# Patient Record
Sex: Female | Born: 1950 | Race: White | Hispanic: No | Smoking: Former smoker
Health system: Southern US, Community
[De-identification: ages and names within clinical notes are randomized; demographics above are authoritative.]

## PROBLEM LIST (undated history)

## (undated) DIAGNOSIS — F0390 Unspecified dementia without behavioral disturbance: Secondary | ICD-10-CM

## (undated) HISTORY — PX: CHOLECYSTECTOMY: SHX55

## (undated) HISTORY — PX: APPENDECTOMY: SHX54

## (undated) HISTORY — PX: ABDOMINAL HYSTERECTOMY: SHX81

---

## 2018-08-24 ENCOUNTER — Emergency Department: Payer: Medicare Other

## 2018-08-24 ENCOUNTER — Encounter: Payer: Self-pay | Admitting: *Deleted

## 2018-08-24 ENCOUNTER — Emergency Department
Admission: EM | Admit: 2018-08-24 | Discharge: 2018-08-24 | Disposition: A | Discharge status: Home / Self Care | Payer: Medicare Other | Attending: Emergency Medicine | Admitting: Emergency Medicine

## 2018-08-24 ENCOUNTER — Other Ambulatory Visit: Payer: Self-pay

## 2018-08-24 DIAGNOSIS — Z79899 Other long term (current) drug therapy: Secondary | ICD-10-CM | POA: Insufficient documentation

## 2018-08-24 DIAGNOSIS — Y998 Other external cause status: Secondary | ICD-10-CM | POA: Diagnosis not present

## 2018-08-24 DIAGNOSIS — S8001XA Contusion of right knee, initial encounter: Secondary | ICD-10-CM

## 2018-08-24 DIAGNOSIS — Y9389 Activity, other specified: Secondary | ICD-10-CM | POA: Insufficient documentation

## 2018-08-24 DIAGNOSIS — S0101XA Laceration without foreign body of scalp, initial encounter: Secondary | ICD-10-CM

## 2018-08-24 DIAGNOSIS — S0083XA Contusion of other part of head, initial encounter: Secondary | ICD-10-CM

## 2018-08-24 DIAGNOSIS — S90111A Contusion of right great toe without damage to nail, initial encounter: Secondary | ICD-10-CM

## 2018-08-24 DIAGNOSIS — W19XXXA Unspecified fall, initial encounter: Secondary | ICD-10-CM

## 2018-08-24 DIAGNOSIS — Z87891 Personal history of nicotine dependence: Secondary | ICD-10-CM | POA: Insufficient documentation

## 2018-08-24 DIAGNOSIS — S0990XA Unspecified injury of head, initial encounter: Secondary | ICD-10-CM

## 2018-08-24 DIAGNOSIS — Y92009 Unspecified place in unspecified non-institutional (private) residence as the place of occurrence of the external cause: Secondary | ICD-10-CM | POA: Insufficient documentation

## 2018-08-24 DIAGNOSIS — F0390 Unspecified dementia without behavioral disturbance: Secondary | ICD-10-CM | POA: Diagnosis not present

## 2018-08-24 DIAGNOSIS — W108XXA Fall (on) (from) other stairs and steps, initial encounter: Secondary | ICD-10-CM | POA: Insufficient documentation

## 2018-08-24 HISTORY — DX: Unspecified dementia, unspecified severity, without behavioral disturbance, psychotic disturbance, mood disturbance, and anxiety: F03.90

## 2018-08-24 LAB — COMPREHENSIVE METABOLIC PANEL
ALK PHOS: 67 U/L (ref 38–126)
ALT: 19 U/L (ref 0–44)
ANION GAP: 7 (ref 5–15)
AST: 26 U/L (ref 15–41)
Albumin: 3.6 g/dL (ref 3.5–5.0)
BILIRUBIN TOTAL: 0.9 mg/dL (ref 0.3–1.2)
BUN: 19 mg/dL (ref 8–23)
CALCIUM: 9 mg/dL (ref 8.9–10.3)
CO2: 27 mmol/L (ref 22–32)
CREATININE: 0.72 mg/dL (ref 0.44–1.00)
Chloride: 103 mmol/L (ref 98–111)
GFR calc Af Amer: >60 mL/min (ref >60)
Glucose, Bld: 130 mg/dL — ABNORMAL HIGH (ref 70–99)
Potassium: 3.8 mmol/L (ref 3.5–5.1)
Sodium: 137 mmol/L (ref 135–145)
TOTAL PROTEIN: 6.4 g/dL — AB (ref 6.5–8.1)

## 2018-08-24 LAB — CBC WITH DIFFERENTIAL/PLATELET
ABS IMMATURE GRANULOCYTES: 0.07 10*3/uL (ref 0.00–0.07)
BASOS ABS: 0.1 10*3/uL (ref 0.0–0.1)
Basophils Relative: 1 %
EOS PCT: 1 %
Eosinophils Absolute: 0.1 10*3/uL (ref 0.0–0.5)
HCT: 37.3 % (ref 36.0–46.0)
Hemoglobin: 12.3 g/dL (ref 12.0–15.0)
Immature Granulocytes: 1 %
LYMPHS PCT: 21 %
Lymphs Abs: 2.3 10*3/uL (ref 0.7–4.0)
MCH: 30.1 pg (ref 26.0–34.0)
MCHC: 33 g/dL (ref 30.0–36.0)
MCV: 91.2 fL (ref 80.0–100.0)
Monocytes Absolute: 0.7 10*3/uL (ref 0.1–1.0)
Monocytes Relative: 7 %
NRBC: 0 % (ref 0.0–0.2)
Neutro Abs: 7.7 10*3/uL (ref 1.7–7.7)
Neutrophils Relative %: 69 %
Platelets: 254 10*3/uL (ref 150–400)
RBC: 4.09 MIL/uL (ref 3.87–5.11)
RDW: 12.8 % (ref 11.5–15.5)
WBC: 10.8 10*3/uL — ABNORMAL HIGH (ref 4.0–10.5)

## 2018-08-24 LAB — PROTIME-INR
INR: 0.91
PROTHROMBIN TIME: 12.2 s (ref 11.4–15.2)

## 2018-08-24 LAB — ETHANOL: Alcohol, Ethyl (B): <10 mg/dL (ref <10)

## 2018-08-24 MED ORDER — HYDROCODONE-ACETAMINOPHEN 5-325 MG PO TABS
1.0000 | ORAL_TABLET | Freq: Once | ORAL | Status: AC
  Administered 2018-08-24: 1 via ORAL
  Filled 2018-08-24: qty 1

## 2018-08-24 MED ORDER — ONDANSETRON 4 MG PO TBDP: 4.0000 mg | ORAL_TABLET | Freq: Three times a day (TID) | ORAL | 0 refills | Status: AC | PRN

## 2018-08-24 MED ORDER — FENTANYL CITRATE (PF) 100 MCG/2ML IJ SOLN
25.0000 ug | Freq: Once | INTRAMUSCULAR | Status: AC
  Administered 2018-08-24: 25 ug via INTRAVENOUS
  Filled 2018-08-24: qty 2

## 2018-08-24 MED ORDER — HYDROCODONE-ACETAMINOPHEN 5-325 MG PO TABS: 1.0000 | ORAL_TABLET | Freq: Four times a day (QID) | ORAL | 0 refills | Status: AC | PRN

## 2018-08-24 NOTE — Discharge Instructions (Signed)
1.  Staple removal in 7 to 10 days. 2.  Apply ice to affected area several times daily. 3.  You may take pain and nausea medicines as needed (Norco/Zofran #15). 4.  Return to the ER for worsening symptoms, persistent vomiting, lethargy or other concerns.

## 2018-08-24 NOTE — ED Provider Notes (Signed)
[facility] Emergency Department Provider Note   ____________________________________________   First MD Initiated Contact with Patient 08/24/18 0134     (approximate)  I have reviewed the triage vital signs and the nursing notes.   HISTORY  Chief Complaint Fall    HPI Rose Reyes is a 67 y.o. female brought to the ED from home via EMS status post fall with head and face injury.  Patient states she turn off the light and turned to go to her bedroom and instead fell down 10-15 carpeted steps, striking her head and face on hardwood.  Incident occurred immediately prior to arrival.  Denies LOC.  Denies anticoagulant use.  EMS reports nosebleed on their arrival which is now controlled.  Patient complains of head and face pain, right knee and right great toe pain.  Denies vision changes, neck pain, chest pain, shortness of breath, abdominal pain, nausea, vomiting or dizziness.   Past Medical History:  Diagnosis Date  . Dementia (HCC)    early onset    There are no active problems to display for this patient.    Prior to Admission medications   Medication Sig Start Date End Date Taking? Authorizing Provider  galantamine (RAZADYNE) 4 MG tablet Take 4 mg by mouth 2 (two) times daily with a meal.   Yes [provider]    Allergies Librium [chlordiazepoxide]; Penicillins; and Sulfa antibiotics  No family history on file.  Social History Social History   Tobacco Use  . Smoking status: Former Smoker    Last attempt to quit: 08/25/1999    Years since quitting: 19.0  . Smokeless tobacco: Never Used  Substance Use Topics  . Alcohol use: Not Currently  . Drug use: Never    Review of Systems  Constitutional: No fever/chills Eyes: No visual changes. ENT: Positive for facial injury.  No sore throat. Cardiovascular: Denies chest pain. Respiratory: Denies shortness of breath. Gastrointestinal: No abdominal pain.  No nausea, no vomiting.  No  diarrhea.  No constipation. Genitourinary: Negative for dysuria. Musculoskeletal: Positive for right knee and great toe pain.  Negative for back pain. Skin: Negative for rash. Neurological: Positive for head injury.  Negative for headaches, focal weakness or numbness.   ____________________________________________   PHYSICAL EXAM:  VITAL SIGNS: ED Triage Vitals  Enc Vitals Group     BP      Pulse      Resp      Temp      Temp src      SpO2      Weight      Height      Head Circumference      Peak Flow      Pain Score      Pain Loc      Pain Edu?      Excl. in GC?     Constitutional: Alert and oriented. Well appearing and in mild acute distress. Eyes: Conjunctivae are normal. PERRL. EOMI. Head: Matted blood to right parietal scalp. Nose: Nasal contusion.  Clotted blood bilateral nares. Mouth/Throat: Mucous membranes are moist.  No dental malocclusion. Neck: No stridor.  No cervical spine tenderness to palpation.  No step-offs or deformities. Cardiovascular: Normal rate, regular rhythm. Grossly normal heart sounds.  Good peripheral circulation. Respiratory: Normal respiratory effort.  No retractions. Lungs CTAB. Gastrointestinal: Soft and nontender to light or deep palpation. No distention. No abdominal bruits. No CVA tenderness. Musculoskeletal: No spinal tenderness to palpation.  Pelvis is stable.  Right  anterior knee tender to palpation with full range of motion.  Right great toe contusion with tenderness to palpation.  2+ distal pulses.  Brisk, less than 5-second capillary refill. Neurologic:  Normal speech and language. No gross focal neurologic deficits are appreciated.  Skin:  Skin is warm, dry and intact. No rash noted. Psychiatric: Mood and affect are normal. Speech and behavior are normal.  ____________________________________________   LABS (all labs ordered are listed, but only abnormal results are displayed)  Labs Reviewed  CBC WITH DIFFERENTIAL/PLATELET  - Abnormal; Notable for the following components:      Result Value   WBC 10.8 (*)    All other components within normal limits  COMPREHENSIVE METABOLIC PANEL  ETHANOL  PROTIME-INR   ____________________________________________  EKG  None ____________________________________________  RADIOLOGY  ED MD interpretation: No acute traumatic injuries  Official radiology report(s): Dg Chest 1 View  Result Date: 08/24/2018 CLINICAL DATA:  Status post fall down 15 steps, with concern for chest injury. Initial encounter. EXAM: CHEST  1 VIEW COMPARISON:  None. FINDINGS: The lungs are well-aerated and clear. There is no evidence of focal opacification, pleural effusion or pneumothorax. The cardiomediastinal silhouette is within normal limits. No acute osseous abnormalities are seen. IMPRESSION: No acute cardiopulmonary process seen. No displaced rib fractures identified. Electronically Signed   By: Roanna Raider M.D.   On: 08/24/2018 02:45   Dg Pelvis 1-2 Views  Result Date: 08/24/2018 CLINICAL DATA:  Status post fall down 15 steps, with concern for pelvic injury. Initial encounter. EXAM: PELVIS - 1-2 VIEW COMPARISON:  None. FINDINGS: There is no evidence of fracture or dislocation. Both femoral heads are seated normally within their respective acetabula. No significant degenerative change is appreciated. The sacroiliac joints are unremarkable in appearance. The visualized bowel gas pattern is grossly unremarkable in appearance. Scattered phleboliths are noted within the pelvis. IMPRESSION: No evidence of fracture or dislocation. Electronically Signed   By: Roanna Raider M.D.   On: 08/24/2018 02:44   Ct Head Wo Contrast  Result Date: 08/24/2018 CLINICAL DATA:  Status post fall down 15 steps, hitting head and nose. Concern for maxillofacial or cervical spine injury. Initial encounter. EXAM: CT HEAD WITHOUT CONTRAST CT MAXILLOFACIAL WITHOUT CONTRAST CT CERVICAL SPINE WITHOUT CONTRAST TECHNIQUE:  Multidetector CT imaging of the head, cervical spine, and maxillofacial structures were performed using the standard protocol without intravenous contrast. Multiplanar CT image reconstructions of the cervical spine and maxillofacial structures were also generated. COMPARISON:  None. FINDINGS: CT HEAD FINDINGS Brain: No evidence of acute infarction, hemorrhage, hydrocephalus, extra-axial collection or mass lesion/mass effect. The posterior fossa, including the cerebellum, brainstem and fourth ventricle, is within normal limits. The third and lateral ventricles, and basal ganglia are unremarkable in appearance. The cerebral hemispheres are symmetric in appearance, with normal gray-white differentiation. No mass effect or midline shift is seen. Vascular: No hyperdense vessel or unexpected calcification. Skull: There is no evidence of fracture; visualized osseous structures are unremarkable in appearance. Other: Soft tissue swelling is noted overlying the left frontal and parietal calvarium, and a soft tissue laceration is noted at the right side of the vertex. CT MAXILLOFACIAL FINDINGS Osseous: There is no evidence of fracture or dislocation. The maxilla and mandible appear intact. The nasal bone is unremarkable in appearance. The visualized dentition demonstrates no acute abnormality. Orbits: The orbits are intact bilaterally. Sinuses: The visualized paranasal sinuses and mastoid air cells are well-aerated. Soft tissues: Diffuse soft tissue swelling is noted about the nose, with mild soft tissue  air. There is suggestion of a 4 mm high-density foreign body at the left side of the nose. The parapharyngeal fat planes are preserved. The nasopharynx, oropharynx and hypopharynx are unremarkable in appearance. The visualized portions of the valleculae and piriform sinuses are grossly unremarkable. The parotid and submandibular glands are within normal limits. No cervical lymphadenopathy is seen. CT CERVICAL SPINE FINDINGS  Alignment: Normal. Skull base and vertebrae: No acute fracture. No primary bone lesion or focal pathologic process. Soft tissues and spinal canal: No prevertebral fluid or swelling. No visible canal hematoma. Disc levels: Multilevel disc space narrowing is noted along the lower cervical spine. There is mild grade 1 anterolisthesis of C2 on C3 and of C3 on C4. Scattered anterior and posterior disc osteophyte complexes are seen along the lower cervical spine, with underlying facet disease. Upper chest: Mild calcification is noted at the carotid bifurcations bilaterally. The visualized lung apices are clear. The thyroid gland is grossly unremarkable. Other: No additional soft tissue abnormalities are seen. IMPRESSION: 1. No evidence of traumatic intracranial injury or fracture. 2. No evidence of fracture or dislocation with regard to the maxillofacial structures. 3. No evidence of fracture or subluxation along the cervical spine. 4. Soft tissue swelling overlying the left frontal and parietal calvarium, and soft tissue laceration at the right side of the vertex. 5. Suggestion of 4 mm high-density foreign body at the left side of the nose. 6. Mild degenerative change along the lower cervical spine. 7. Mild calcification at the carotid bifurcations bilaterally. Electronically Signed   By: Roanna Raider M.D.   On: 08/24/2018 02:34   Ct Cervical Spine Wo Contrast  Result Date: 08/24/2018 CLINICAL DATA:  Status post fall down 15 steps, hitting head and nose. Concern for maxillofacial or cervical spine injury. Initial encounter. EXAM: CT HEAD WITHOUT CONTRAST CT MAXILLOFACIAL WITHOUT CONTRAST CT CERVICAL SPINE WITHOUT CONTRAST TECHNIQUE: Multidetector CT imaging of the head, cervical spine, and maxillofacial structures were performed using the standard protocol without intravenous contrast. Multiplanar CT image reconstructions of the cervical spine and maxillofacial structures were also generated. COMPARISON:  None.  FINDINGS: CT HEAD FINDINGS Brain: No evidence of acute infarction, hemorrhage, hydrocephalus, extra-axial collection or mass lesion/mass effect. The posterior fossa, including the cerebellum, brainstem and fourth ventricle, is within normal limits. The third and lateral ventricles, and basal ganglia are unremarkable in appearance. The cerebral hemispheres are symmetric in appearance, with normal gray-white differentiation. No mass effect or midline shift is seen. Vascular: No hyperdense vessel or unexpected calcification. Skull: There is no evidence of fracture; visualized osseous structures are unremarkable in appearance. Other: Soft tissue swelling is noted overlying the left frontal and parietal calvarium, and a soft tissue laceration is noted at the right side of the vertex. CT MAXILLOFACIAL FINDINGS Osseous: There is no evidence of fracture or dislocation. The maxilla and mandible appear intact. The nasal bone is unremarkable in appearance. The visualized dentition demonstrates no acute abnormality. Orbits: The orbits are intact bilaterally. Sinuses: The visualized paranasal sinuses and mastoid air cells are well-aerated. Soft tissues: Diffuse soft tissue swelling is noted about the nose, with mild soft tissue air. There is suggestion of a 4 mm high-density foreign body at the left side of the nose. The parapharyngeal fat planes are preserved. The nasopharynx, oropharynx and hypopharynx are unremarkable in appearance. The visualized portions of the valleculae and piriform sinuses are grossly unremarkable. The parotid and submandibular glands are within normal limits. No cervical lymphadenopathy is seen. CT CERVICAL SPINE FINDINGS Alignment: Normal.  Skull base and vertebrae: No acute fracture. No primary bone lesion or focal pathologic process. Soft tissues and spinal canal: No prevertebral fluid or swelling. No visible canal hematoma. Disc levels: Multilevel disc space narrowing is noted along the lower  cervical spine. There is mild grade 1 anterolisthesis of C2 on C3 and of C3 on C4. Scattered anterior and posterior disc osteophyte complexes are seen along the lower cervical spine, with underlying facet disease. Upper chest: Mild calcification is noted at the carotid bifurcations bilaterally. The visualized lung apices are clear. The thyroid gland is grossly unremarkable. Other: No additional soft tissue abnormalities are seen. IMPRESSION: 1. No evidence of traumatic intracranial injury or fracture. 2. No evidence of fracture or dislocation with regard to the maxillofacial structures. 3. No evidence of fracture or subluxation along the cervical spine. 4. Soft tissue swelling overlying the left frontal and parietal calvarium, and soft tissue laceration at the right side of the vertex. 5. Suggestion of 4 mm high-density foreign body at the left side of the nose. 6. Mild degenerative change along the lower cervical spine. 7. Mild calcification at the carotid bifurcations bilaterally. Electronically Signed   By: Roanna Raider M.D.   On: 08/24/2018 02:34   Dg Knee Complete 4 Views Right  Result Date: 08/24/2018 CLINICAL DATA:  Status post fall down 15 stairs, with acute onset of right knee pain. Initial encounter. EXAM: RIGHT KNEE - COMPLETE 4+ VIEW COMPARISON:  None. FINDINGS: There is no evidence of fracture or dislocation. The joint spaces are preserved. No significant degenerative change is seen; the patellofemoral joint is grossly unremarkable in appearance. No significant joint effusion is seen. The visualized soft tissues are normal in appearance. IMPRESSION: No evidence of fracture or dislocation. Electronically Signed   By: Roanna Raider M.D.   On: 08/24/2018 02:45   Dg Foot Complete Right  Result Date: 08/24/2018 CLINICAL DATA:  Status post fall down 15 steps, with acute onset of right great toe pain. Initial encounter. EXAM: RIGHT FOOT COMPLETE - 3+ VIEW COMPARISON:  None. FINDINGS: There is no  evidence of fracture or dislocation. The joint spaces are preserved. There is no evidence of talar subluxation; the subtalar joint is unremarkable in appearance. An os peroneum is noted. A plantar calcaneal spur is seen. No significant soft tissue abnormalities are seen. IMPRESSION: 1. No evidence of fracture or dislocation. 2. Os peroneum noted. Electronically Signed   By: Roanna Raider M.D.   On: 08/24/2018 02:46   Ct Maxillofacial Wo Cm  Result Date: 08/24/2018 CLINICAL DATA:  Status post fall down 15 steps, hitting head and nose. Concern for maxillofacial or cervical spine injury. Initial encounter. EXAM: CT HEAD WITHOUT CONTRAST CT MAXILLOFACIAL WITHOUT CONTRAST CT CERVICAL SPINE WITHOUT CONTRAST TECHNIQUE: Multidetector CT imaging of the head, cervical spine, and maxillofacial structures were performed using the standard protocol without intravenous contrast. Multiplanar CT image reconstructions of the cervical spine and maxillofacial structures were also generated. COMPARISON:  None. FINDINGS: CT HEAD FINDINGS Brain: No evidence of acute infarction, hemorrhage, hydrocephalus, extra-axial collection or mass lesion/mass effect. The posterior fossa, including the cerebellum, brainstem and fourth ventricle, is within normal limits. The third and lateral ventricles, and basal ganglia are unremarkable in appearance. The cerebral hemispheres are symmetric in appearance, with normal gray-white differentiation. No mass effect or midline shift is seen. Vascular: No hyperdense vessel or unexpected calcification. Skull: There is no evidence of fracture; visualized osseous structures are unremarkable in appearance. Other: Soft tissue swelling is noted overlying the  left frontal and parietal calvarium, and a soft tissue laceration is noted at the right side of the vertex. CT MAXILLOFACIAL FINDINGS Osseous: There is no evidence of fracture or dislocation. The maxilla and mandible appear intact. The nasal bone is  unremarkable in appearance. The visualized dentition demonstrates no acute abnormality. Orbits: The orbits are intact bilaterally. Sinuses: The visualized paranasal sinuses and mastoid air cells are well-aerated. Soft tissues: Diffuse soft tissue swelling is noted about the nose, with mild soft tissue air. There is suggestion of a 4 mm high-density foreign body at the left side of the nose. The parapharyngeal fat planes are preserved. The nasopharynx, oropharynx and hypopharynx are unremarkable in appearance. The visualized portions of the valleculae and piriform sinuses are grossly unremarkable. The parotid and submandibular glands are within normal limits. No cervical lymphadenopathy is seen. CT CERVICAL SPINE FINDINGS Alignment: Normal. Skull base and vertebrae: No acute fracture. No primary bone lesion or focal pathologic process. Soft tissues and spinal canal: No prevertebral fluid or swelling. No visible canal hematoma. Disc levels: Multilevel disc space narrowing is noted along the lower cervical spine. There is mild grade 1 anterolisthesis of C2 on C3 and of C3 on C4. Scattered anterior and posterior disc osteophyte complexes are seen along the lower cervical spine, with underlying facet disease. Upper chest: Mild calcification is noted at the carotid bifurcations bilaterally. The visualized lung apices are clear. The thyroid gland is grossly unremarkable. Other: No additional soft tissue abnormalities are seen. IMPRESSION: 1. No evidence of traumatic intracranial injury or fracture. 2. No evidence of fracture or dislocation with regard to the maxillofacial structures. 3. No evidence of fracture or subluxation along the cervical spine. 4. Soft tissue swelling overlying the left frontal and parietal calvarium, and soft tissue laceration at the right side of the vertex. 5. Suggestion of 4 mm high-density foreign body at the left side of the nose. 6. Mild degenerative change along the lower cervical spine. 7.  Mild calcification at the carotid bifurcations bilaterally. Electronically Signed   By: Roanna Raider M.D.   On: 08/24/2018 02:34    ____________________________________________   PROCEDURES  Procedure(s) performed:     Marland KitchenMarland KitchenLaceration Repair Date/Time: 08/24/2018 3:37 AM Performed by: Irean Hong, MD Authorized by: Irean Hong, MD   Consent:    Consent obtained:  Verbal   Consent given by:  Patient   Risks discussed:  Infection, pain, poor cosmetic result and poor wound healing Anesthesia (see MAR for exact dosages):    Anesthesia method:  Topical application   Topical anesthesia: PainEase. Laceration details:    Location:  Scalp   Scalp location:  R parietal   Length (cm):  3 Repair type:    Repair type:  Simple Exploration:    Hemostasis achieved with:  Direct pressure   Wound exploration: entire depth of wound probed and visualized     Contaminated: no   Treatment:    Area cleansed with:  Saline   Amount of cleaning:  Standard   Irrigation solution:  Sterile saline   Visualized foreign bodies/material removed: no   Skin repair:    Repair method:  Staples   Number of staples:  4 Approximation:    Approximation:  Loose Post-procedure details:    Dressing:  Open (no dressing)   Patient tolerance of procedure:  Tolerated well, no immediate complications    Critical Care performed: No  ____________________________________________   INITIAL IMPRESSION / ASSESSMENT AND PLAN / ED COURSE  As part of  my medical decision making, I reviewed the following data within the electronic MEDICAL RECORD NUMBER Nursing notes reviewed and incorporated, Old chart reviewed, Radiograph reviewed and Notes from prior ED visits   67 year old female not on anticoagulation who presents with head and facial injury status post mechanical fall down 10-15 steps.  Differential diagnosis includes but is not limited to ICH, subarachnoid hemorrhage, subdural hematoma, cervical spine injury,  facial fracture, musculoskeletal fracture/dislocation, etc.  Will obtain CT imaging studies of head/cervical spine/maxillofacial.  Obtain plain film x-rays of chest, pelvis, right knee and great toe.  Initiate IV fluid resuscitation. Administer 25 mcg IV fentanyl for pain, paired with 4 mg IV Zofran for nausea.  Will reassess.  Clinical Course as of Aug 24 336  Sat Aug 24, 2018  0249 Updated patient and family member on all test results.  Will administer oral Norco.  Will clean scalp and staple laceration.  Reexamined left nare.  Patient denies wearing nose ring.  There is no foreign body visualized.  I suspect CT scan reflects clotted nasal blood at left nares.   [JS]  [id number] Patient tolerated staples well.  Strict return precautions given.  Patient and family member verbalized understanding and agree with plan of care.   [JS]    Clinical Course User Index [JS] Irean Hong, MD     ____________________________________________   FINAL CLINICAL IMPRESSION(S) / ED DIAGNOSES  Final diagnoses:  Fall, initial encounter  Injury of head, initial encounter  Laceration of scalp, initial encounter  Facial contusion, initial encounter  Contusion of right knee, initial encounter  Contusion of right great toe without damage to nail, initial encounter     ED Discharge Orders    None       Note:  This document was prepared using Dragon voice recognition software and may include unintentional dictation errors.    Irean Hong, MD 08/24/18 [phone]

## 2018-08-24 NOTE — ED Triage Notes (Signed)
Per EMS pt was going to bed and missteped and fell down approx 15 stairs hitting her head, nose and R knee and R great toe. Pt is A&O x4 and a good historian

## 2019-05-02 IMAGING — CT CT CERVICAL SPINE W/O CM
5 of 10 series · 9 of 33 positions shown, 10 images · non-contrast
Comparison: None.

CLINICAL DATA: Status post fall down 15 steps, hitting head and
nose. Concern for maxillofacial or cervical spine injury. Initial
encounter.

EXAM:
CT HEAD WITHOUT CONTRAST
CT MAXILLOFACIAL WITHOUT CONTRAST
CT CERVICAL SPINE WITHOUT CONTRAST
TECHNIQUE: Multidetector CT imaging of the head, cervical spine, and
maxillofacial structures were performed using the standard protocol
without intravenous contrast. Multiplanar CT image reconstructions
of the cervical spine and maxillofacial structures were also
generated.

[Series 8: c spine soft · axial · 0.33mm/px · z∈[-217,-167]mm · 2 of 77 slices shown]
[im 26/77  soft-tissue]
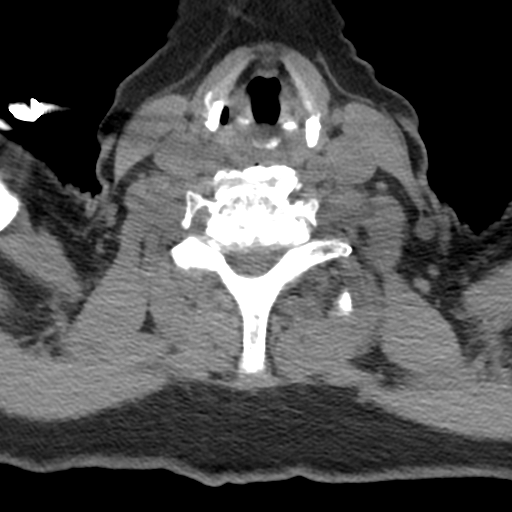
[im 51/77  soft-tissue]
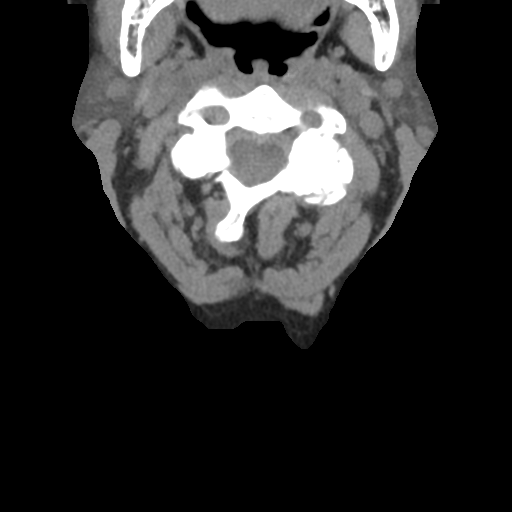

[Series 10: sagittal bone · sagittal · 0.23mm/px · 1 of 47 slices shown]
[im 24/47  bone]
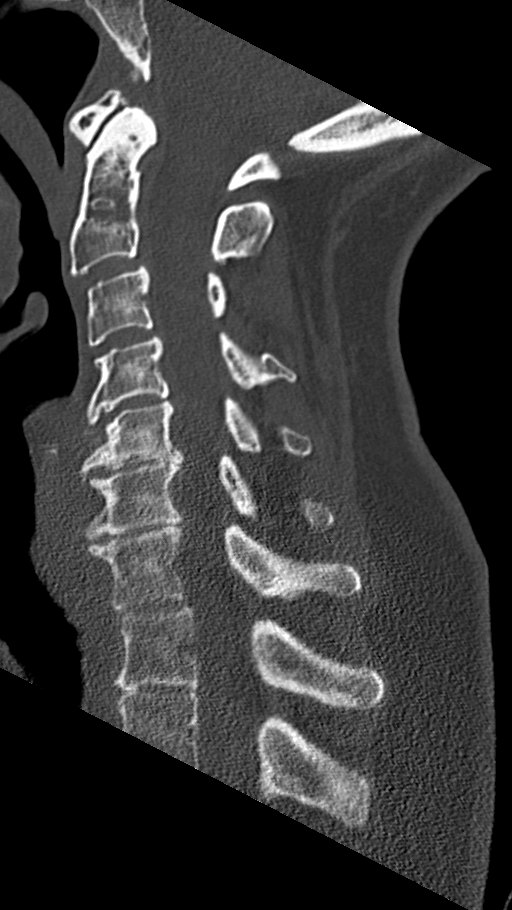

[Series 12: orthogonal axials · axial · 0.20mm/px · z∈[-266,-183]mm · 3 of 99 slices shown, 4 images]
[im 25/99  soft-tissue]
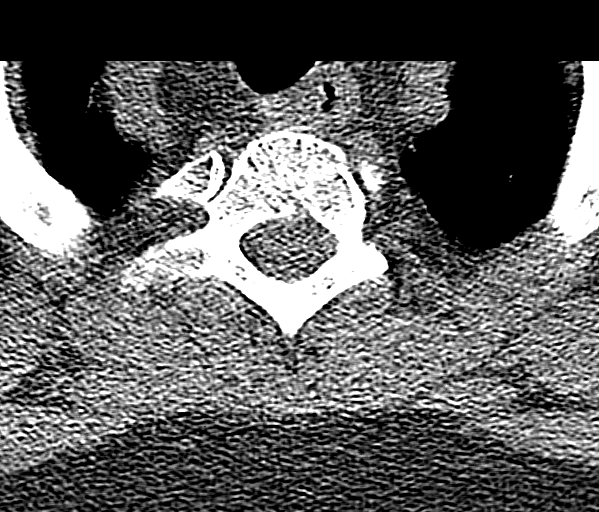
[im 25/99  bone]
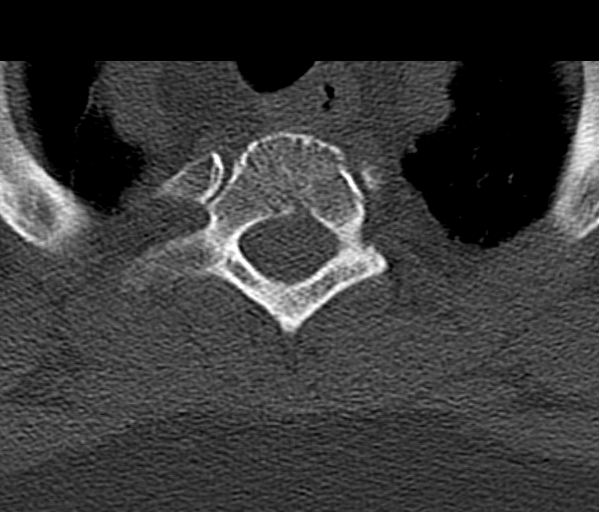
[im 50/99  bone]
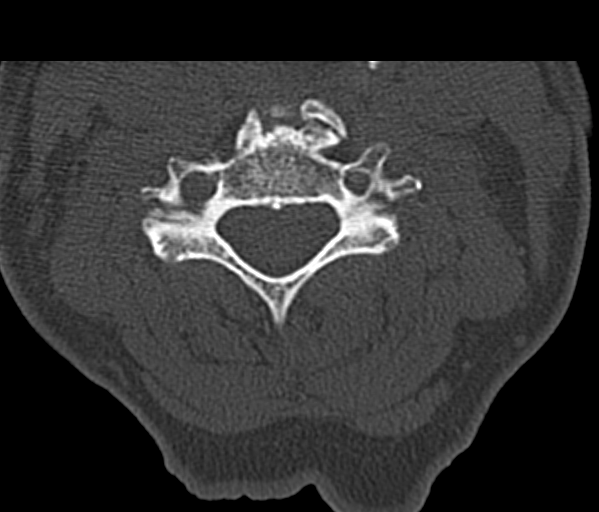
[im 74/99  bone]
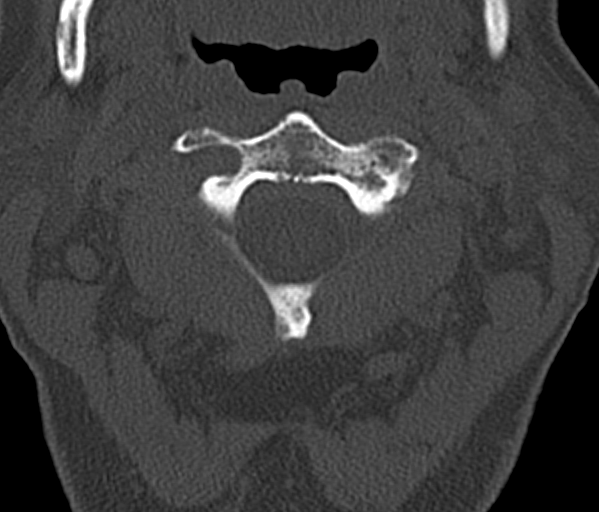

[Series 13: max soft · axial · 0.33mm/px · z∈[-179,-123]mm · 2 of 86 slices shown]
[im 29/86  soft-tissue]
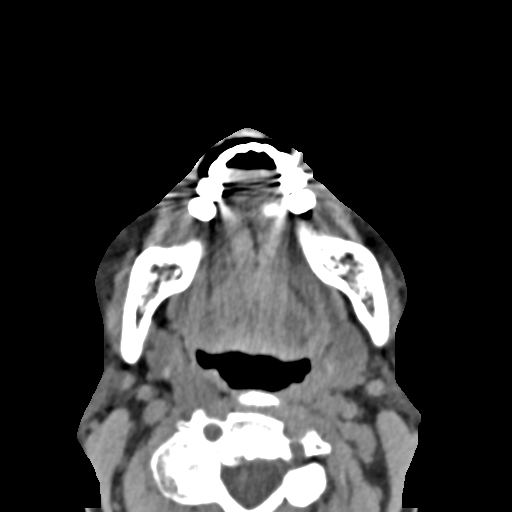
[im 57/86  soft-tissue]
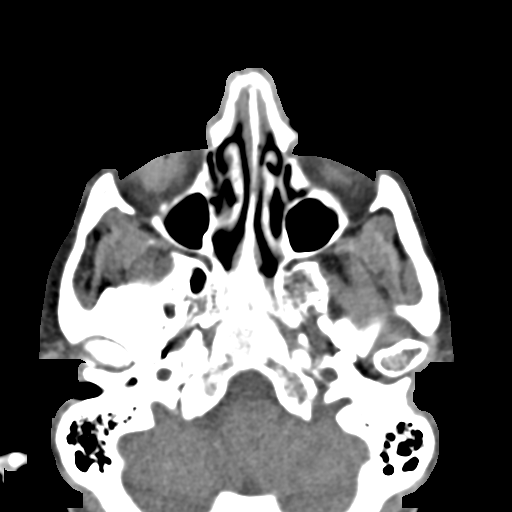

[Series 16: coronal soft · coronal · 0.36mm/px · 1 of 90 slices shown]
[im 45/90  bone]
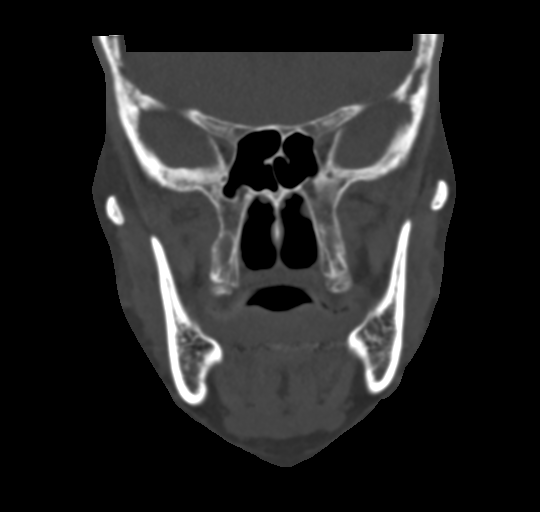

[9 of 33 positions shown; findings below may reference images not displayed]

FINDINGS: CT HEAD FINDINGS

Brain: No evidence of acute infarction, hemorrhage, hydrocephalus,
extra-axial collection or mass lesion/mass effect.

The posterior fossa, including the cerebellum, brainstem and fourth
ventricle, is within normal limits. The third and lateral
ventricles, and basal ganglia are unremarkable in appearance. The
cerebral hemispheres are symmetric in appearance, with normal
gray-white differentiation. No mass effect or midline shift is seen.

Vascular: No hyperdense vessel or unexpected calcification.

Skull: There is no evidence of fracture; visualized osseous
structures are unremarkable in appearance.

Other: Soft tissue swelling is noted overlying the left frontal and
parietal calvarium, and a soft tissue laceration is noted at the
right side of the vertex.

CT MAXILLOFACIAL FINDINGS

Osseous: There is no evidence of fracture or dislocation. The
maxilla and mandible appear intact. The nasal bone is unremarkable
in appearance. The visualized dentition demonstrates no acute
abnormality.

Orbits: The orbits are intact bilaterally.

Sinuses: The visualized paranasal sinuses and mastoid air cells are
well-aerated.

Soft tissues: Diffuse soft tissue swelling is noted about the nose,
with mild soft tissue air. There is suggestion of a 4 mm
high-density foreign body at the left side of the nose. The
parapharyngeal fat planes are preserved. The nasopharynx, oropharynx
and hypopharynx are unremarkable in appearance. The visualized
portions of the valleculae and piriform sinuses are grossly
unremarkable.

The parotid and submandibular glands are within normal limits. No
cervical lymphadenopathy is seen.

CT CERVICAL SPINE FINDINGS

Alignment: Normal.

Skull base and vertebrae: No acute fracture. No primary bone lesion
or focal pathologic process.

Soft tissues and spinal canal: No prevertebral fluid or swelling. No
visible canal hematoma.

Disc levels: Multilevel disc space narrowing is noted along the
lower cervical spine. There is mild grade 1 anterolisthesis of C2 on
C3 and of C3 on C4. Scattered anterior and posterior disc osteophyte
complexes are seen along the lower cervical spine, with underlying
facet disease.

Upper chest: Mild calcification is noted at the carotid bifurcations
bilaterally. The visualized lung apices are clear. The thyroid gland
is grossly unremarkable.

Other: No additional soft tissue abnormalities are seen.
IMPRESSION: 1. No evidence of traumatic intracranial injury or fracture.
2. No evidence of fracture or dislocation with regard to the
maxillofacial structures.
3. No evidence of fracture or subluxation along the cervical spine.
4. Soft tissue swelling overlying the left frontal and parietal
calvarium, and soft tissue laceration at the right side of the
vertex.
5. Suggestion of 4 mm high-density foreign body at the left side of
the nose.
6. Mild degenerative change along the lower cervical spine.
7. Mild calcification at the carotid bifurcations bilaterally.

## 2024-02-16 ENCOUNTER — Ambulatory Visit: Admission: EM | Admit: 2024-02-16 | Discharge: 2024-02-16 | Disposition: A | Discharge status: Home / Self Care

## 2024-02-16 DIAGNOSIS — S30861A Insect bite (nonvenomous) of abdominal wall, initial encounter: Secondary | ICD-10-CM | POA: Diagnosis not present

## 2024-02-16 DIAGNOSIS — L089 Local infection of the skin and subcutaneous tissue, unspecified: Secondary | ICD-10-CM | POA: Diagnosis not present

## 2024-02-16 DIAGNOSIS — L03313 Cellulitis of chest wall: Secondary | ICD-10-CM | POA: Diagnosis not present

## 2024-02-16 DIAGNOSIS — W57XXXA Bitten or stung by nonvenomous insect and other nonvenomous arthropods, initial encounter: Secondary | ICD-10-CM

## 2024-02-16 DIAGNOSIS — Z23 Encounter for immunization: Secondary | ICD-10-CM | POA: Diagnosis not present

## 2024-02-16 MED ORDER — TETANUS-DIPHTH-ACELL PERTUSSIS 5-2.5-18.5 LF-MCG/0.5 IM SUSY
0.5000 mL | PREFILLED_SYRINGE | Freq: Once | INTRAMUSCULAR | Status: AC
  Administered 2024-02-16: 0.5 mL via INTRAMUSCULAR

## 2024-02-16 MED ORDER — DOXYCYCLINE HYCLATE 100 MG PO CAPS: 100.0000 mg | ORAL_CAPSULE | Freq: Two times a day (BID) | ORAL | 0 refills | Status: AC

## 2024-02-16 NOTE — Discharge Instructions (Signed)
 Start doxycycline to cover for a skin infection as well as any tickborne illness.  Stay out of the sun while you are on this medication as it will cause you to have a sunburn.  Keep the area clean with soap and water.  Use a marker to draw a circle around the area of redness so that we can tell if this is expanding or getting smaller with the treatment.  If anything worsens and you have fever, increasing pain, rapid spread of the redness, red streaks, nausea, vomiting you should be seen immediately.

## 2024-02-16 NOTE — ED Provider Notes (Signed)
 Arlander Bellman    CSN: [id number] Arrival date & time: 02/16/24  1310      History   Chief Complaint Chief Complaint  Patient presents with   Insect Bite    HPI Rose Reyes is a 73 y.o. female.   Patient presents today with a 4-day history of from a tick bite on her left anterior chest wall.  She was able to remove the tick in its entirety and brings in for evaluation to clinic.  Initially she was cleaning this with soap and water and applying hydrogen peroxide and antibiotic ointment.  Her daughter has been monitoring this and has noticed that the redness has been spreading which prompted her evaluation today.  She does report some soreness particularly with palpation that is rated 4 on a 0-10 pain scale.  Denies any fever, nausea, vomiting, facial asymmetry, headache, palpitations, dizziness, arthralgias.  She denies any recent antibiotics in the past 90 days.    Past Medical History:  Diagnosis Date   Dementia (HCC)    early onset    There are no active problems to display for this patient.   Past Surgical History:  Procedure Laterality Date   ABDOMINAL HYSTERECTOMY     APPENDECTOMY     CHOLECYSTECTOMY      OB History   No obstetric history on file.      Home Medications    Prior to Admission medications   Medication Sig Start Date End Date Taking? Authorizing Provider  doxycycline (VIBRAMYCIN) 100 MG capsule Take 1 capsule (100 mg total) by mouth 2 (two) times daily. 02/16/24  Yes Syrus Nakama K, PA-C  sertraline (ZOLOFT) 25 MG tablet Take 12.5-25 mg by mouth daily. 09/11/23  Yes [provider]  galantamine (RAZADYNE) 4 MG tablet Take 4 mg by mouth 2 (two) times daily with a meal.    [provider]  HYDROcodone -acetaminophen  (NORCO) 5-325 MG tablet Take 1 tablet by mouth every 6 (six) hours as needed for moderate pain. Patient not taking: Reported on 02/16/2024 08/24/18   Sung, Jade J, MD  ondansetron  (ZOFRAN  ODT) 4 MG disintegrating  tablet Take 1 tablet (4 mg total) by mouth every 8 (eight) hours as needed for nausea or vomiting. Patient not taking: Reported on 02/16/2024 08/24/18   Norlene Beavers, MD    Family History History reviewed. No pertinent family history.  Social History Social History   Tobacco Use   Smoking status: Former    Current packs/day: 0.00    Types: Cigarettes    Quit date: 08/25/1999    Years since quitting: 24.4   Smokeless tobacco: Never  Substance Use Topics   Alcohol use: Not Currently   Drug use: Never     Allergies   Librium [chlordiazepoxide], Penicillins, and Sulfa antibiotics   Review of Systems Review of Systems  Constitutional:  Negative for activity change, appetite change, fatigue and fever.  Respiratory:  Negative for shortness of breath.   Cardiovascular:  Negative for chest pain and palpitations.  Gastrointestinal:  Negative for abdominal pain, diarrhea, nausea and vomiting.  Musculoskeletal:  Negative for arthralgias and myalgias.  Skin:  Positive for color change and wound.  Neurological:  Negative for dizziness, facial asymmetry, weakness, light-headedness and headaches.     Physical Exam Triage Vital Signs ED Triage Vitals  Encounter Vitals Group     BP 02/16/24 1436 116/78     Systolic BP Percentile --      Diastolic BP Percentile --  Pulse Rate 02/16/24 1436 71     Resp 02/16/24 1436 19     Temp 02/16/24 1436 97.8 F (36.6 C)     Temp src --      SpO2 02/16/24 1436 98 %     Weight --      Height --      Head Circumference --      Peak Flow --      Pain Score 02/16/24 1431 5     Pain Loc --      Pain Education --      Exclude from Growth Chart --    No data found.  Updated Vital Signs BP 116/78   Pulse 71   Temp 97.8 F (36.6 C)   Resp 19   SpO2 98%   Visual Acuity Right Eye Distance:   Left Eye Distance:   Bilateral Distance:    Right Eye Near:   Left Eye Near:    Bilateral Near:     Physical Exam Vitals reviewed.   Constitutional:      General: She is awake. She is not in acute distress.    Appearance: Normal appearance. She is well-developed. She is not ill-appearing.     Comments: Very pleasant female appears stated age in no acute distress sitting comfortably in exam room  HENT:     Head: Normocephalic and atraumatic.  Cardiovascular:     Rate and Rhythm: Normal rate and regular rhythm.     Heart sounds: Normal heart sounds, S1 normal and S2 normal. No murmur heard. Pulmonary:     Effort: Pulmonary effort is normal.     Breath sounds: Normal breath sounds. No wheezing, rhonchi or rales.     Comments: Clear to auscultation bilaterally Skin:    Findings: Erythema and wound present.       Psychiatric:        Behavior: Behavior is cooperative.      UC Treatments / Results  Labs (all labs ordered are listed, but only abnormal results are displayed) Labs Reviewed - No data to display  EKG   Radiology No results found.  Procedures Procedures (including critical care time)  Medications Ordered in UC Medications  Tdap (BOOSTRIX) injection 0.5 mL (0.5 mLs Intramuscular Given 02/16/24 1501)    Initial Impression / Assessment and Plan / UC Course  I have reviewed the triage vital signs and the nursing notes.  Pertinent labs & imaging results that were available during my care of the patient were reviewed by me and considered in my medical decision making (see chart for details).     Patient is well-appearing, afebrile, nontoxic, nontachycardic.  She was able to provide the took which did not appear engorged and appears to be [employer] tick.  I have a low suspicion for tickborne illness given this is not engorged and unlikely to have been attached for any significant length of time and more suspicion for cellulitis.  We will use doxycycline as this will cover for both tickborne illness and cellulitis.  Discussed that she is to avoid prolonged sun exposure while on this medication due to  associated photosensitivity.  She is unsure when her last tetanus was and we do not have any record of a tetanus shot recently so this was updated given wound.  Recommend close follow-up with her primary care.  She is to demarcate the area of erythema with a marker and if this worsens and spreads despite the antibiotic she is to return for  reevaluation.  We discussed that if anything worsens she should return immediately including fever, rapid spread of erythema, red streaks, increasing pain, arthralgias, nausea/vomiting.  Final Clinical Impressions(s) / UC Diagnoses   Final diagnoses:  Cellulitis of chest wall  Infected tick bite of abdominal wall, initial encounter     Discharge Instructions      Start doxycycline to cover for a skin infection as well as any tickborne illness.  Stay out of the sun while you are on this medication as it will cause you to have a sunburn.  Keep the area clean with soap and water.  Use a marker to draw a circle around the area of redness so that we can tell if this is expanding or getting smaller with the treatment.  If anything worsens and you have fever, increasing pain, rapid spread of the redness, red streaks, nausea, vomiting you should be seen immediately.     ED Prescriptions     Medication Sig Dispense Auth. Provider   doxycycline (VIBRAMYCIN) 100 MG capsule Take 1 capsule (100 mg total) by mouth 2 (two) times daily. 20 capsule Floyd Lusignan K, PA-C      PDMP not reviewed this encounter.   Budd Cargo, PA-C 02/16/24 1515

## 2024-02-16 NOTE — ED Triage Notes (Signed)
 Patient to Urgent Care with complaints of a tick bite. Reports the tick was present for approx 6-12 hours. Has the tick with her in a ziplock bag.   Removed the tick from the right side of her ribs. Has some residual soreness and redness.   Cleaned the area with hydrogen peroxide and applied antibiotic ointment.
